# Patient Record
Sex: Female | Born: 1965 | Race: Asian | Hispanic: No | Marital: Single | Smoking: Never smoker
Health system: Southern US, Community
[De-identification: ages and names within clinical notes are randomized; demographics above are authoritative.]

## PROBLEM LIST (undated history)

## (undated) DIAGNOSIS — Z789 Other specified health status: Secondary | ICD-10-CM

---

## 2017-03-14 ENCOUNTER — Other Ambulatory Visit (HOSPITAL_COMMUNITY)
Admission: RE | Admit: 2017-03-14 | Discharge: 2017-03-14 | Disposition: A | Discharge status: Home / Self Care | Payer: BLUE CROSS/BLUE SHIELD | Source: Ambulatory Visit | Attending: Nurse Practitioner | Admitting: Nurse Practitioner

## 2017-03-14 ENCOUNTER — Ambulatory Visit (INDEPENDENT_AMBULATORY_CARE_PROVIDER_SITE_OTHER): Payer: BLUE CROSS/BLUE SHIELD | Admitting: Nurse Practitioner

## 2017-03-14 ENCOUNTER — Encounter: Payer: Self-pay | Admitting: Nurse Practitioner

## 2017-03-14 VITALS — BP 118/76 | HR 67 | Temp 97.7°F | Ht 61.0 in | Wt 125.0 lb

## 2017-03-14 DIAGNOSIS — Z1231 Encounter for screening mammogram for malignant neoplasm of breast: Secondary | ICD-10-CM | POA: Diagnosis not present

## 2017-03-14 DIAGNOSIS — Z124 Encounter for screening for malignant neoplasm of cervix: Secondary | ICD-10-CM | POA: Insufficient documentation

## 2017-03-14 DIAGNOSIS — Z0001 Encounter for general adult medical examination with abnormal findings: Secondary | ICD-10-CM

## 2017-03-14 DIAGNOSIS — Z1211 Encounter for screening for malignant neoplasm of colon: Secondary | ICD-10-CM

## 2017-03-14 DIAGNOSIS — Z1239 Encounter for other screening for malignant neoplasm of breast: Secondary | ICD-10-CM

## 2017-03-14 DIAGNOSIS — J069 Acute upper respiratory infection, unspecified: Secondary | ICD-10-CM

## 2017-03-14 MED ORDER — BENZONATATE 100 MG PO CAPS: 100.0000 mg | ORAL_CAPSULE | Freq: Three times a day (TID) | ORAL | 0 refills | Status: DC | PRN

## 2017-03-14 MED ORDER — GUAIFENESIN-DM 100-10 MG/5ML PO SYRP: 5.0000 mL | ORAL_SOLUTION | ORAL | 0 refills | Status: DC | PRN

## 2017-03-14 MED ORDER — IPRATROPIUM BROMIDE 0.03 % NA SOLN: 2.0000 | Freq: Two times a day (BID) | NASAL | 0 refills | Status: DC

## 2017-03-14 NOTE — Progress Notes (Signed)
Pre visit review using our clinic review tool, if applicable. No additional management support is needed unless otherwise documented below in the visit note. 

## 2017-03-14 NOTE — Progress Notes (Signed)
Subjective:    Patient ID: Elizabeth Ashley, female    DOB: 08/06/1966, 51 y.o.   MRN: [id number]  Patient presents today for complete physical or establish care (new patient) and URI URI   This is a new problem. The current episode started yesterday. The problem has been unchanged. There has been no fever. Associated symptoms include congestion, coughing, rhinorrhea, sinus pain, sneezing and a sore throat. Pertinent negatives include no abdominal pain, chest pain, diarrhea, dysuria, ear pain, headaches, joint pain, joint swelling, nausea, neck pain, plugged ear sensation, rash, swollen glands, vomiting or wheezing. She has tried nothing for the symptoms.   No previous pcp.  Immunizations: (TDAP, Hep C screen, Pneumovax, Influenza, zoster)  Health Maintenance  Topic Date Due  . HIV Screening  04/02/1980  . Tetanus Vaccine  04/02/1984  . Pap Smear  04/02/1986  . Mammogram  04/03/2015  . Colon Cancer Screening  04/03/2015  . Flu Shot  Addressed   Diet:regular Weight:  Wt Readings from Last 3 Encounters:  03/14/17 125 lb (56.7 kg)   Exercise: Fall Risk: Fall Risk  03/14/2017  Falls in the past year? No   Home Safety: single, home with 1child (13yrs), also has 52yrs old girl. Depression/Suicide: Depression screen [facility] 2/9 03/14/2017  Decreased Interest 0  Down, Depressed, Hopeless 0  PHQ - 2 Score 0   No flowsheet data found. Colonoscopy (every 5-82yrs, >50-63yrs):needed Pap Smear (every 9yrs for >21-29 without HPV, every 65yrs for >30-32yrs with HPV):needed Mammogram (yearly, >77yrs):needed  Vision:needed, patient will Dental:up to date Sexual History (birth control, marital status, STD):single  Medications and allergies reviewed with patient and updated if appropriate.  There are no active problems to display for this patient.   No current outpatient prescriptions on file prior to visit.   No current facility-administered medications on file prior to visit.     History  reviewed. No pertinent past medical history.  History reviewed. No pertinent surgical history.  Social History   Social History  . Marital status: Single    Spouse name: N/A  . Number of children: N/A  . Years of education: N/A   Social History Main Topics  . Smoking status: Never Smoker  . Smokeless tobacco: Never Used  . Alcohol use Yes     Comment: everyday  . Drug use: No  . Sexual activity: Not Asked   Other Topics Concern  . None   Social History Narrative  . None    History reviewed. No pertinent family history.      Review of Systems  HENT: Positive for congestion, rhinorrhea, sinus pain, sneezing and sore throat. Negative for ear pain.   Respiratory: Positive for cough. Negative for wheezing.   Cardiovascular: Negative for chest pain.  Gastrointestinal: Negative for abdominal pain, diarrhea, nausea and vomiting.  Genitourinary: Negative for dysuria.  Musculoskeletal: Negative for joint pain and neck pain.  Skin: Negative for rash.  Neurological: Negative for headaches.    Objective:   Vitals:   03/14/17 1512  BP: 118/76  Pulse: 67  Temp: 97.7 F (36.5 C)    Body mass index is 23.62 kg/m.   Physical Examination:  Physical Exam  Constitutional: She is oriented to person, place, and time and well-developed, well-nourished, and in no distress. No distress.  HENT:  Right Ear: Tympanic membrane, external ear and ear canal normal.  Left Ear: Tympanic membrane, external ear and ear canal normal.  Nose: Mucosal edema and rhinorrhea present. Right sinus exhibits no maxillary sinus  tenderness and no frontal sinus tenderness. Left sinus exhibits no maxillary sinus tenderness and no frontal sinus tenderness.  Mouth/Throat: Uvula is midline. Posterior oropharyngeal erythema present. No oropharyngeal exudate.  Eyes: Conjunctivae and EOM are normal. Pupils are equal, round, and reactive to light. No scleral icterus.  Neck: Normal range of motion. Neck  supple. No thyromegaly present.  Cardiovascular: Normal rate, regular rhythm, normal heart sounds and intact distal pulses.   Pulmonary/Chest: Effort normal and breath sounds normal. She exhibits no tenderness. Right breast exhibits no inverted nipple, no mass, no nipple discharge, no skin change and no tenderness. Left breast exhibits no inverted nipple, no mass, no nipple discharge, no skin change and no tenderness. Breasts are symmetrical.  Abdominal: Soft. Bowel sounds are normal. She exhibits no distension. There is no tenderness.  Genitourinary: Rectum normal, vagina normal, right adnexa normal, left adnexa normal and vulva normal. Rectal exam shows no external hemorrhoid. Uterus is not tender. Cervix exhibits no motion tenderness and no tenderness. No vaginal discharge found.  Musculoskeletal: Normal range of motion. She exhibits no edema or tenderness.  Lymphadenopathy:    She has no cervical adenopathy.  Neurological: She is alert and oriented to person, place, and time. Gait normal.  Skin: Skin is warm and dry.  Psychiatric: Affect and judgment normal.  Vitals reviewed.   ASSESSMENT and PLAN:  Wrenn was seen today for establish care.  Diagnoses and all orders for this visit:  Encounter for preventative adult health care exam with abnormal findings -     HIV antibody; Future -     CBC w/Diff; Future -     Comprehensive metabolic panel; Future -     TSH; Future -     Lipid panel; Future -     Cytology - PAP  Acute URI -     ipratropium (ATROVENT) 0.03 % nasal spray; Place 2 sprays into both nostrils 2 (two) times daily. Do not use for more than 5days. -     guaiFENesin-dextromethorphan (ROBITUSSIN DM) 100-10 MG/5ML syrup; Take 5 mLs by mouth every 4 (four) hours as needed for cough. -     benzonatate (TESSALON) 100 MG capsule; Take 1 capsule (100 mg total) by mouth 3 (three) times daily as needed for cough.  Colon cancer screening -     Ambulatory referral to  Gastroenterology  Encounter for Papanicolaou smear for cervical cancer screening -     Cytology - PAP  Breast cancer screening -     MM Digital Screening; Future   No problem-specific Assessment & Plan notes found for this encounter.     Follow up: Return in about 2 weeks (around 03/28/2017) for shoulder mass.  Wilfred Lacy, NP

## 2017-03-14 NOTE — Patient Instructions (Signed)
URI Instructions: Encourage adequate oral hydration.  Use over-the-counter  "cold" medicines  such as "Tylenol cold" , "Advil cold",  "Mucinex" or" Mucinex D"  for cough and congestion.  Avoid decongestants if you have high blood pressure. Use" Delsym" or" Robitussin" cough syrup varietis for cough.  You can use plain "Tylenol" or "Advi"l for fever, chills and achyness.   "Common cold" symptoms are usually triggered by a virus.  The antibiotics are usually not necessary. On average, a" viral cold" illness would take 4-7 days to resolve. Please, make an appointment if you are not better or if you're worse.  Health Maintenance, Female Adopting a healthy lifestyle and getting preventive care can go a long way to promote health and wellness. Talk with your health care provider about what schedule of regular examinations is right for you. This is a good chance for you to check in with your provider about disease prevention and staying healthy. In between checkups, there are plenty of things you can do on your own. Experts have done a lot of research about which lifestyle changes and preventive measures are most likely to keep you healthy. Ask your health care provider for more information. Weight and diet Eat a healthy diet  Be sure to include plenty of vegetables, fruits, low-fat dairy products, and lean protein.  Do not eat a lot of foods high in solid fats, added sugars, or salt.  Get regular exercise. This is one of the most important things you can do for your health.  Most adults should exercise for at least 150 minutes each week. The exercise should increase your heart rate and make you sweat (moderate-intensity exercise).  Most adults should also do strengthening exercises at least twice a week. This is in addition to the moderate-intensity exercise. Maintain a healthy weight  Body mass index (BMI) is a measurement that can be used to identify possible weight problems. It estimates body  fat based on height and weight. Your health care provider can help determine your BMI and help you achieve or maintain a healthy weight.  For females 48 years of age and older:  A BMI below 18.5 is considered underweight.  A BMI of 18.5 to 24.9 is normal.  A BMI of 25 to 29.9 is considered overweight.  A BMI of 30 and above is considered obese. Watch levels of cholesterol and blood lipids  You should start having your blood tested for lipids and cholesterol at 52 years of age, then have this test every 5 years.  You may need to have your cholesterol levels checked more often if:  Your lipid or cholesterol levels are high.  You are older than 52 years of age.  You are at high risk for heart disease. Cancer screening Lung Cancer  Lung cancer screening is recommended for adults 61-49 years old who are at high risk for lung cancer because of a history of smoking.  A yearly low-dose CT scan of the lungs is recommended for people who:  Currently smoke.  Have quit within the past 15 years.  Have at least a 30-pack-year history of smoking. A pack year is smoking an average of one pack of cigarettes a day for 1 year.  Yearly screening should continue until it has been 15 years since you quit.  Yearly screening should stop if you develop a health problem that would prevent you from having lung cancer treatment. Breast Cancer  Practice breast self-awareness. This means understanding how your breasts normally appear and  feel.  It also means doing regular breast self-exams. Let your health care provider know about any changes, no matter how small.  If you are in your 20s or 30s, you should have a clinical breast exam (CBE) by a health care provider every 1-3 years as part of a regular health exam.  If you are 68 or older, have a CBE every year. Also consider having a breast X-ray (mammogram) every year.  If you have a family history of breast cancer, talk to your health care  provider about genetic screening.  If you are at high risk for breast cancer, talk to your health care provider about having an MRI and a mammogram every year.  Breast cancer gene (BRCA) assessment is recommended for women who have family members with BRCA-related cancers. BRCA-related cancers include:  Breast.  Ovarian.  Tubal.  Peritoneal cancers.  Results of the assessment will determine the need for genetic counseling and BRCA1 and BRCA2 testing. Cervical Cancer  Your health care provider may recommend that you be screened regularly for cancer of the pelvic organs (ovaries, uterus, and vagina). This screening involves a pelvic examination, including checking for microscopic changes to the surface of your cervix (Pap test). You may be encouraged to have this screening done every 3 years, beginning at age 63.  For women ages 14-65, health care providers may recommend pelvic exams and Pap testing every 3 years, or they may recommend the Pap and pelvic exam, combined with testing for human papilloma virus (HPV), every 5 years. Some types of HPV increase your risk of cervical cancer. Testing for HPV may also be done on women of any age with unclear Pap test results.  Other health care providers may not recommend any screening for nonpregnant women who are considered low risk for pelvic cancer and who do not have symptoms. Ask your health care provider if a screening pelvic exam is right for you.  If you have had past treatment for cervical cancer or a condition that could lead to cancer, you need Pap tests and screening for cancer for at least 20 years after your treatment. If Pap tests have been discontinued, your risk factors (such as having a new sexual partner) need to be reassessed to determine if screening should resume. Some women have medical problems that increase the chance of getting cervical cancer. In these cases, your health care provider may recommend more frequent screening and  Pap tests. Colorectal Cancer  This type of cancer can be detected and often prevented.  Routine colorectal cancer screening usually begins at 52 years of age and continues through 52 years of age.  Your health care provider may recommend screening at an earlier age if you have risk factors for colon cancer.  Your health care provider may also recommend using home test kits to check for hidden blood in the stool.  A small camera at the end of a tube can be used to examine your colon directly (sigmoidoscopy or colonoscopy). This is done to check for the earliest forms of colorectal cancer.  Routine screening usually begins at age 45.  Direct examination of the colon should be repeated every 5-10 years through 52 years of age. However, you may need to be screened more often if early forms of precancerous polyps or small growths are found. Skin Cancer  Check your skin from head to toe regularly.  Tell your health care provider about any new moles or changes in moles, especially if there is a  change in a mole's shape or color.  Also tell your health care provider if you have a mole that is larger than the size of a pencil eraser.  Always use sunscreen. Apply sunscreen liberally and repeatedly throughout the day.  Protect yourself by wearing long sleeves, pants, a wide-brimmed hat, and sunglasses whenever you are outside. Heart disease, diabetes, and high blood pressure  High blood pressure causes heart disease and increases the risk of stroke. High blood pressure is more likely to develop in:  People who have blood pressure in the high end of the normal range (130-139/85-89 mm Hg).  People who are overweight or obese.  People who are African American.  If you are 78-56 years of age, have your blood pressure checked every 3-5 years. If you are 28 years of age or older, have your blood pressure checked every year. You should have your blood pressure measured twice-once when you are at a  hospital or clinic, and once when you are not at a hospital or clinic. Record the average of the two measurements. To check your blood pressure when you are not at a hospital or clinic, you can use:  An automated blood pressure machine at a pharmacy.  A home blood pressure monitor.  If you are between 44 years and 43 years old, ask your health care provider if you should take aspirin to prevent strokes.  Have regular diabetes screenings. This involves taking a blood sample to check your fasting blood sugar level.  If you are at a normal weight and have a low risk for diabetes, have this test once every three years after 52 years of age.  If you are overweight and have a high risk for diabetes, consider being tested at a younger age or more often. Preventing infection Hepatitis B  If you have a higher risk for hepatitis B, you should be screened for this virus. You are considered at high risk for hepatitis B if:  You were born in a country where hepatitis B is common. Ask your health care provider which countries are considered high risk.  Your parents were born in a high-risk country, and you have not been immunized against hepatitis B (hepatitis B vaccine).  You have HIV or AIDS.  You use needles to inject street drugs.  You live with someone who has hepatitis B.  You have had sex with someone who has hepatitis B.  You get hemodialysis treatment.  You take certain medicines for conditions, including cancer, organ transplantation, and autoimmune conditions. Hepatitis C  Blood testing is recommended for:  Everyone born from [birth year] through 1966.  Anyone with known risk factors for hepatitis C. Sexually transmitted infections (STIs)  You should be screened for sexually transmitted infections (STIs) including gonorrhea and chlamydia if:  You are sexually active and are younger than 52 years of age.  You are older than 52 years of age and your health care provider tells you  that you are at risk for this type of infection.  Your sexual activity has changed since you were last screened and you are at an increased risk for chlamydia or gonorrhea. Ask your health care provider if you are at risk.  If you do not have HIV, but are at risk, it may be recommended that you take a prescription medicine daily to prevent HIV infection. This is called pre-exposure prophylaxis (PrEP). You are considered at risk if:  You are sexually active and do not regularly use condoms or  know the HIV status of your partner(s).  You take drugs by injection.  You are sexually active with a partner who has HIV. Talk with your health care provider about whether you are at high risk of being infected with HIV. If you choose to begin PrEP, you should first be tested for HIV. You should then be tested every 3 months for as long as you are taking PrEP. Pregnancy  If you are premenopausal and you may become pregnant, ask your health care provider about preconception counseling.  If you may become pregnant, take 400 to 800 micrograms (mcg) of folic acid every day.  If you want to prevent pregnancy, talk to your health care provider about birth control (contraception). Osteoporosis and menopause  Osteoporosis is a disease in which the bones lose minerals and strength with aging. This can result in serious bone fractures. Your risk for osteoporosis can be identified using a bone density scan.  If you are 46 years of age or older, or if you are at risk for osteoporosis and fractures, ask your health care provider if you should be screened.  Ask your health care provider whether you should take a calcium or vitamin D supplement to lower your risk for osteoporosis.  Menopause may have certain physical symptoms and risks.  Hormone replacement therapy may reduce some of these symptoms and risks. Talk to your health care provider about whether hormone replacement therapy is right for you. Follow  these instructions at home:  Schedule regular health, dental, and eye exams.  Stay current with your immunizations.  Do not use any tobacco products including cigarettes, chewing tobacco, or electronic cigarettes.  If you are pregnant, do not drink alcohol.  If you are breastfeeding, limit how much and how often you drink alcohol.  Limit alcohol intake to no more than 1 drink per day for nonpregnant women. One drink equals 12 ounces of beer, 5 ounces of wine, or 1 ounces of hard liquor.  Do not use street drugs.  Do not share needles.  Ask your health care provider for help if you need support or information about quitting drugs.  Tell your health care provider if you often feel depressed.  Tell your health care provider if you have ever been abused or do not feel safe at home. This information is not intended to replace advice given to you by your health care provider. Make sure you discuss any questions you have with your health care provider. Document Released: 07/02/2011 Document Revised: 05/24/2016 Document Reviewed: 09/20/2015 Elsevier Interactive Patient Education  2017 [employer].

## 2017-03-19 LAB — CYTOLOGY - PAP
ADEQUACY: ABSENT
DIAGNOSIS: NEGATIVE
HPV (WINDOPATH): NOT DETECTED

## 2017-03-28 ENCOUNTER — Other Ambulatory Visit (INDEPENDENT_AMBULATORY_CARE_PROVIDER_SITE_OTHER): Payer: BLUE CROSS/BLUE SHIELD

## 2017-03-28 ENCOUNTER — Encounter: Payer: Self-pay | Admitting: Nurse Practitioner

## 2017-03-28 ENCOUNTER — Ambulatory Visit (INDEPENDENT_AMBULATORY_CARE_PROVIDER_SITE_OTHER): Payer: BLUE CROSS/BLUE SHIELD | Admitting: Nurse Practitioner

## 2017-03-28 VITALS — BP 110/70 | HR 64 | Temp 97.9°F | Ht 61.0 in | Wt 123.0 lb

## 2017-03-28 DIAGNOSIS — Z0001 Encounter for general adult medical examination with abnormal findings: Secondary | ICD-10-CM | POA: Diagnosis not present

## 2017-03-28 DIAGNOSIS — R2231 Localized swelling, mass and lump, right upper limb: Secondary | ICD-10-CM | POA: Diagnosis not present

## 2017-03-28 DIAGNOSIS — Z23 Encounter for immunization: Secondary | ICD-10-CM

## 2017-03-28 DIAGNOSIS — M25511 Pain in right shoulder: Secondary | ICD-10-CM | POA: Diagnosis not present

## 2017-03-28 LAB — COMPREHENSIVE METABOLIC PANEL
ALBUMIN: 4.5 g/dL (ref 3.5–5.2)
ALT: 14 U/L (ref 0–35)
AST: 18 U/L (ref 0–37)
Alkaline Phosphatase: 64 U/L (ref 39–117)
BUN: 19 mg/dL (ref 6–23)
CHLORIDE: 104 meq/L (ref 96–112)
CO2: 32 meq/L (ref 19–32)
CREATININE: 0.7 mg/dL (ref 0.40–1.20)
Calcium: 9.5 mg/dL (ref 8.4–10.5)
GFR: 93.4 mL/min (ref >60.00)
Glucose, Bld: 82 mg/dL (ref 70–99)
Potassium: 3.8 mEq/L (ref 3.5–5.1)
SODIUM: 141 meq/L (ref 135–145)
Total Bilirubin: 0.5 mg/dL (ref 0.2–1.2)
Total Protein: 7.6 g/dL (ref 6.0–8.3)

## 2017-03-28 LAB — CBC WITH DIFFERENTIAL/PLATELET
BASOS PCT: 0.9 % (ref 0.0–3.0)
Basophils Absolute: 0 10*3/uL (ref 0.0–0.1)
Eosinophils Absolute: 0.2 10*3/uL (ref 0.0–0.7)
Eosinophils Relative: 4.7 % (ref 0.0–5.0)
HCT: 40.7 % (ref 36.0–46.0)
HEMOGLOBIN: 13.2 g/dL (ref 12.0–15.0)
Lymphocytes Relative: 36.4 % (ref 12.0–46.0)
Lymphs Abs: 1.7 10*3/uL (ref 0.7–4.0)
MCHC: 32.5 g/dL (ref 30.0–36.0)
MCV: 93 fl (ref 78.0–100.0)
MONO ABS: 0.4 10*3/uL (ref 0.1–1.0)
Monocytes Relative: 8.5 % (ref 3.0–12.0)
Neutro Abs: 2.3 10*3/uL (ref 1.4–7.7)
Neutrophils Relative %: 49.5 % (ref 43.0–77.0)
PLATELETS: 323 10*3/uL (ref 150.0–400.0)
RBC: 4.38 Mil/uL (ref 3.87–5.11)
RDW: 13.2 % (ref 11.5–15.5)
WBC: 4.6 10*3/uL (ref 4.0–10.5)

## 2017-03-28 LAB — LIPID PANEL
CHOL/HDL RATIO: 3
Cholesterol: 195 mg/dL (ref 0–200)
HDL: 73.2 mg/dL (ref >39.00)
LDL CALC: 107 mg/dL — AB (ref 0–99)
NONHDL: 121.75
Triglycerides: 75 mg/dL (ref 0.0–149.0)
VLDL: 15 mg/dL (ref 0.0–40.0)

## 2017-03-28 LAB — TSH: TSH: 0.74 u[IU]/mL (ref 0.35–4.50)

## 2017-03-28 NOTE — Progress Notes (Signed)
Normal results

## 2017-03-28 NOTE — Progress Notes (Signed)
Pre visit review using our clinic review tool, if applicable. No additional management support is needed unless otherwise documented below in the visit note. 

## 2017-03-28 NOTE — Patient Instructions (Signed)
You will be called with appt for ultrasound.

## 2017-03-28 NOTE — Progress Notes (Addendum)
   Subjective:  Patient ID: Elizabeth Ashley, female    DOB: 1966/04/19  Age: 51 y.o. MRN: [id number]  CC: Follow-up (follow up mass on right shoulder. tdap?)  HPI  Elizabeth Ashley presents with right posterior shoulder mass. Onset several years ago, bt has noticed gradual increase in size and intermittent tenderness around shoulder joint. Denies any paresthesia or arm swelling.  Outpatient Medications Prior to Visit  Medication Sig Dispense Refill  . multivitamin-iron-minerals-folic acid (CENTRUM) chewable tablet Chew 1 tablet by mouth daily.    . benzonatate (TESSALON) 100 MG capsule Take 1 capsule (100 mg total) by mouth 3 (three) times daily as needed for cough. (Patient not taking: Reported on 03/28/2017) 20 capsule 0  . guaiFENesin-dextromethorphan (ROBITUSSIN DM) 100-10 MG/5ML syrup Take 5 mLs by mouth every 4 (four) hours as needed for cough. (Patient not taking: Reported on 03/28/2017) 118 mL 0  . ipratropium (ATROVENT) 0.03 % nasal spray Place 2 sprays into both nostrils 2 (two) times daily. Do not use for more than 5days. (Patient not taking: Reported on 03/28/2017) 30 mL 0   No facility-administered medications prior to visit.     ROS See HPI  Objective:  BP 110/70   Pulse 64   Temp 97.9 F (36.6 C)   Ht 5\' 1"  (1.549 m)   Wt 123 lb (55.8 kg)   SpO2 97%   BMI 23.24 kg/m   BP Readings from Last 3 Encounters:  03/28/17 110/70  03/14/17 118/76    Wt Readings from Last 3 Encounters:  03/28/17 123 lb (55.8 kg)  03/14/17 125 lb (56.7 kg)    Physical Exam  Cardiovascular: Normal rate.   Pulmonary/Chest: Effort normal.  Musculoskeletal: Normal range of motion.       Right shoulder: She exhibits swelling. She exhibits normal range of motion, no tenderness, no bony tenderness, no effusion, no crepitus, no deformity, no laceration, no pain, no spasm, normal pulse and normal strength.       Arms: Skin: Lesion noted.  Vitals reviewed.   Lab Results  Component Value Date   WBC 4.6  03/28/2017   HGB 13.2 03/28/2017   HCT 40.7 03/28/2017   PLT 323.0 03/28/2017   GLUCOSE 82 03/28/2017   CHOL 195 03/28/2017   TRIG 75.0 03/28/2017   HDL 73.20 03/28/2017   LDLCALC 107 (H) 03/28/2017   ALT 14 03/28/2017   AST 18 03/28/2017   NA 141 03/28/2017   K 3.8 03/28/2017   CL 104 03/28/2017   CREATININE 0.70 03/28/2017   BUN 19 03/28/2017   CO2 32 03/28/2017   TSH 0.74 03/28/2017    No results found.  Assessment & Plan:   Elizabeth Ashley was seen today for follow-up.  Diagnoses and all orders for this visit:  Mass of skin of shoulder, right -     [place] Chest; Future  Acute pain of right shoulder -     [place] Chest; Future  Need for diphtheria-tetanus-pertussis (Tdap) vaccine, adult/adolescent -     Tdap vaccine greater than or equal to 7yo IM   I have discontinued Elizabeth Ashley's ipratropium, guaiFENesin-dextromethorphan, and benzonatate. I am also having her maintain her multivitamin-iron-minerals-folic acid.  No orders of the defined types were placed in this encounter.   Follow-up: Return if symptoms worsen or fail to improve.  Wilfred Lacy, NP

## 2017-03-29 LAB — HIV ANTIBODY (ROUTINE TESTING W REFLEX): HIV: NONREACTIVE

## 2017-03-29 NOTE — Progress Notes (Signed)
Normal results

## 2017-04-04 ENCOUNTER — Ambulatory Visit
Admission: RE | Admit: 2017-04-04 | Discharge: 2017-04-04 | Disposition: A | Discharge status: Home / Self Care | Payer: BLUE CROSS/BLUE SHIELD | Source: Ambulatory Visit | Attending: Nurse Practitioner | Admitting: Nurse Practitioner

## 2017-04-04 DIAGNOSIS — M25511 Pain in right shoulder: Secondary | ICD-10-CM

## 2017-04-04 DIAGNOSIS — R2231 Localized swelling, mass and lump, right upper limb: Secondary | ICD-10-CM

## 2017-04-05 ENCOUNTER — Other Ambulatory Visit: Payer: Self-pay | Admitting: Nurse Practitioner

## 2017-04-05 DIAGNOSIS — R2231 Localized swelling, mass and lump, right upper limb: Secondary | ICD-10-CM

## 2017-04-05 DIAGNOSIS — R229 Localized swelling, mass and lump, unspecified: Secondary | ICD-10-CM

## 2017-04-05 DIAGNOSIS — M25511 Pain in right shoulder: Secondary | ICD-10-CM

## 2017-04-10 ENCOUNTER — Other Ambulatory Visit: Payer: Self-pay | Admitting: Nurse Practitioner

## 2017-04-10 DIAGNOSIS — J069 Acute upper respiratory infection, unspecified: Secondary | ICD-10-CM

## 2017-04-23 ENCOUNTER — Encounter: Payer: Self-pay | Admitting: Nurse Practitioner

## 2017-04-23 ENCOUNTER — Ambulatory Visit
Admission: RE | Admit: 2017-04-23 | Discharge: 2017-04-23 | Disposition: A | Discharge status: Home / Self Care | Payer: BLUE CROSS/BLUE SHIELD | Source: Ambulatory Visit | Attending: Nurse Practitioner | Admitting: Nurse Practitioner

## 2017-04-23 DIAGNOSIS — Z1239 Encounter for other screening for malignant neoplasm of breast: Secondary | ICD-10-CM

## 2017-04-23 IMAGING — US US CHEST/MEDIASTINUM
1 series · 14 of 14 positions shown · non-contrast
Comparison: None.

CLINICAL DATA: Mass right shoulder with pain

EXAM:
CHEST ULTRASOUND

[Series 1: us chest/mediastinum · 0.06mm/px · 14 of 14 slices shown]
[im 1/14]
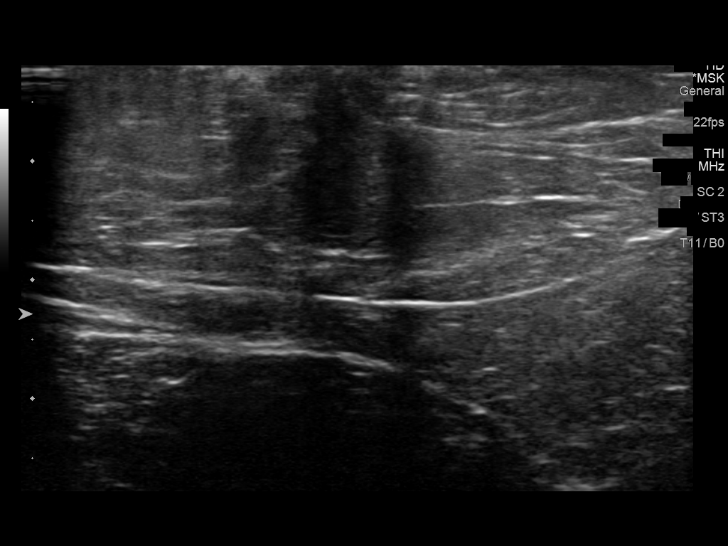
[im 2/14]
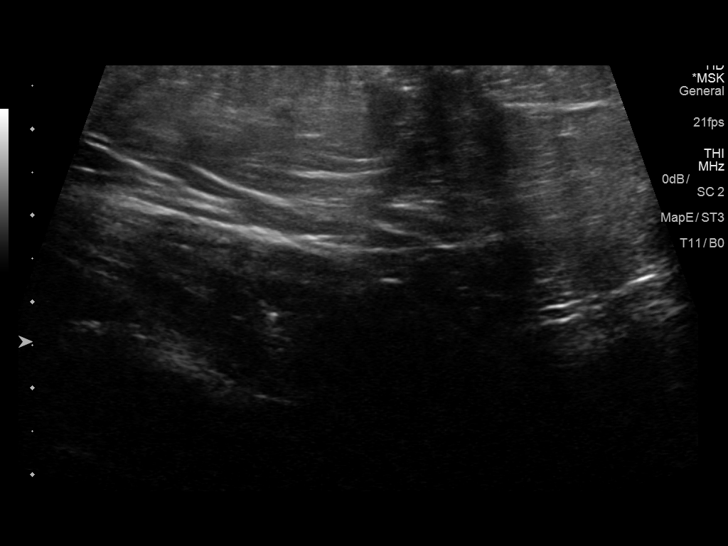
[im 3/14]
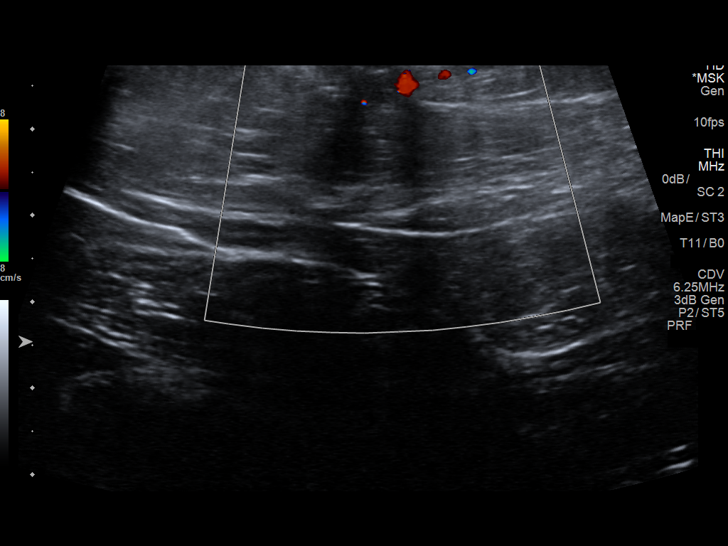
[im 4/14]
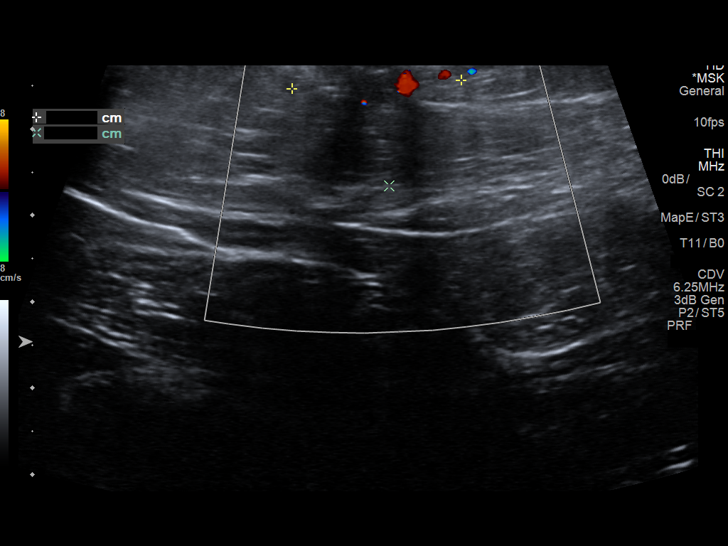
[im 5/14]
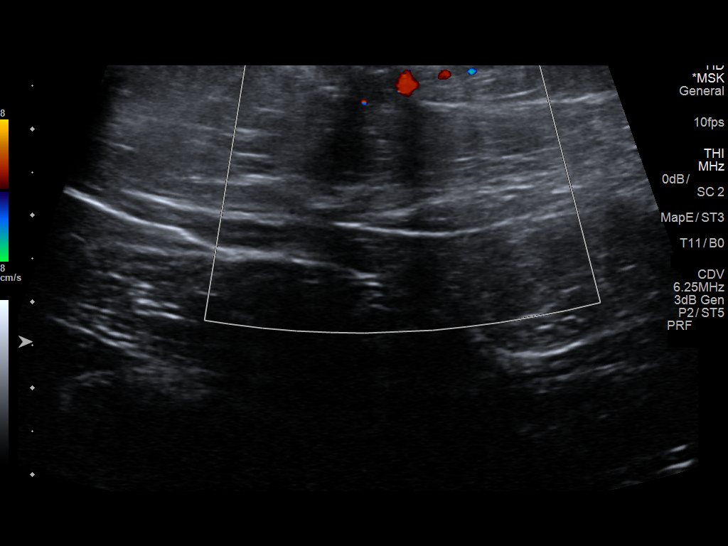
[im 6/14]
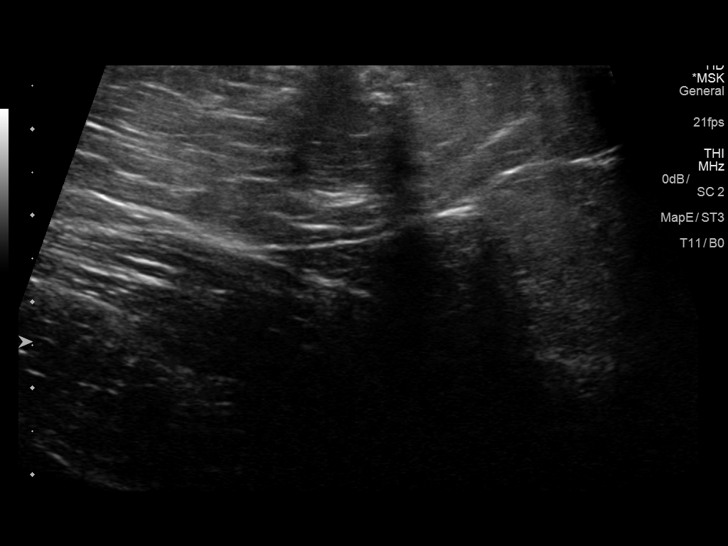
[im 7/14]
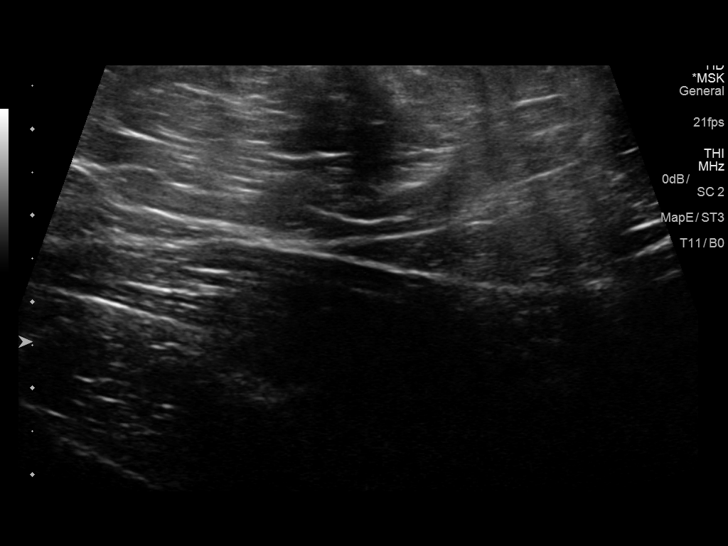
[im 8/14]
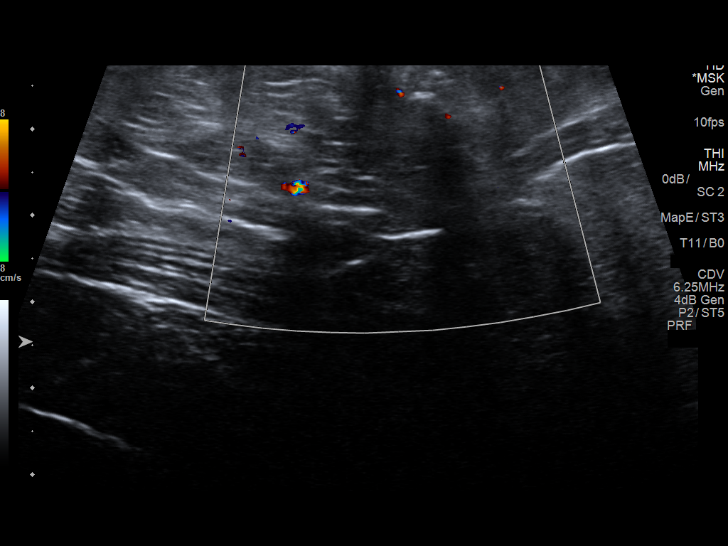
[im 9/14]
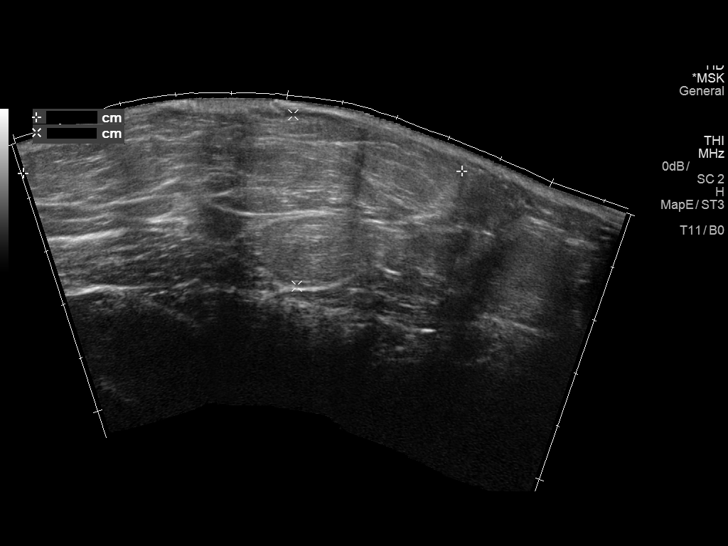
[im 10/14]
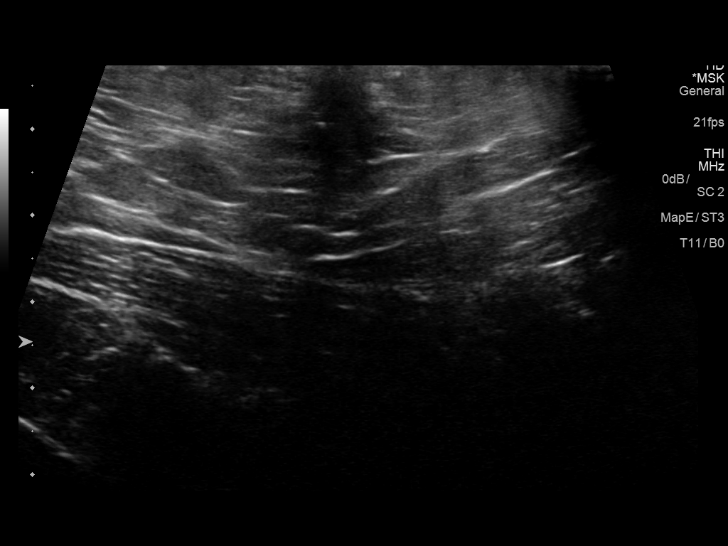
[im 11/14]
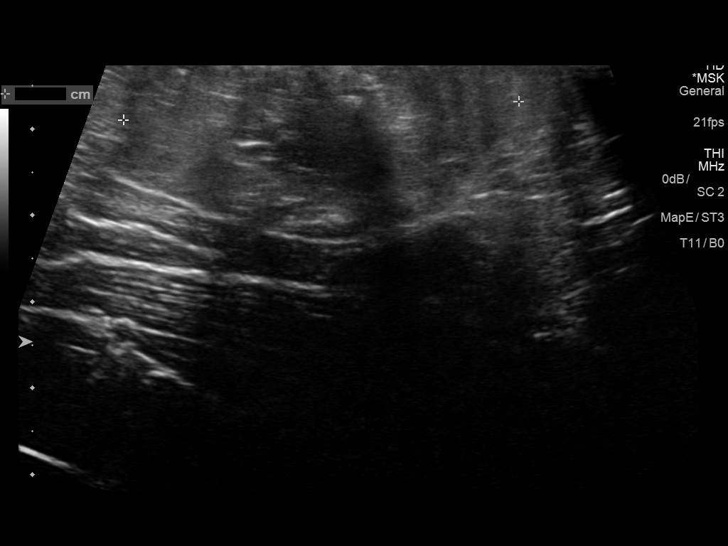
[im 12/14]
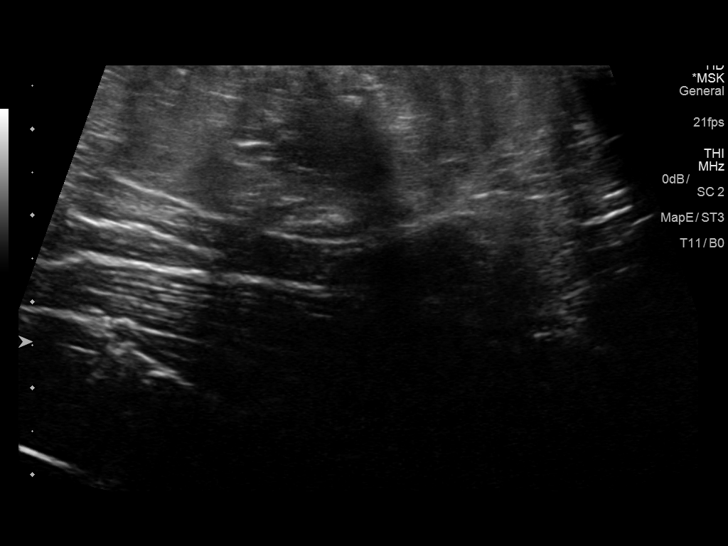
[im 13/14]
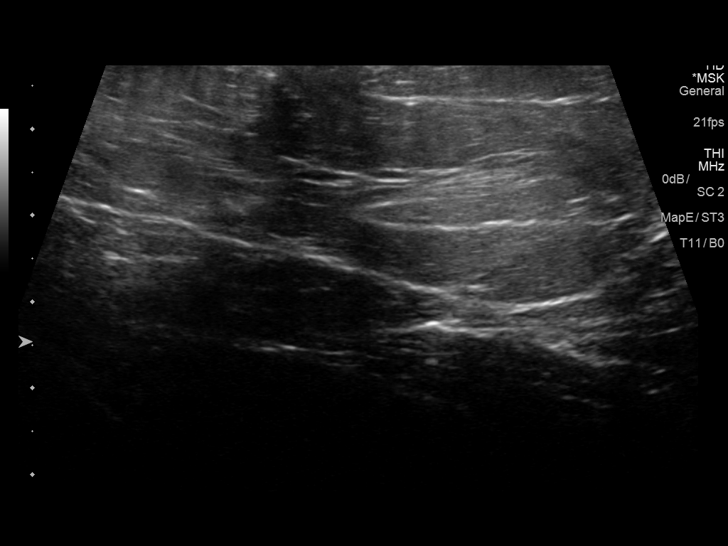
[im 14/14]
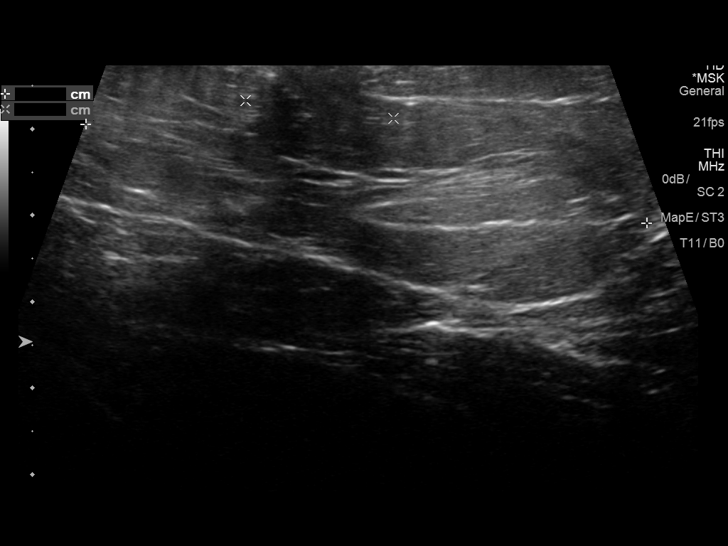

[14 of 14 positions shown; findings below may reference images not displayed]

FINDINGS: Scanning limited to the area of concern posterior to the shoulder
joint overlying the scapula

Solid superficial mass is seen in the subcutaneous tissues. The mass
measures 2 x 1.6 cm and is predominately hypoechoic without
shadowing. This is surrounded by fatty tissue. Margins are
ill-defined.
IMPRESSION: 2 x 1.6 cm solid mass in the subcutaneous tissues overlying the
scapula. This appearance is nonspecific but this is not a cyst.
Biopsy if clinically appropriate.

## 2017-05-09 ENCOUNTER — Encounter: Payer: Self-pay | Admitting: Nurse Practitioner

## 2017-06-07 ENCOUNTER — Encounter (HOSPITAL_COMMUNITY)
Admission: RE | Admit: 2017-06-07 | Discharge: 2017-06-07 | Disposition: A | Discharge status: Home / Self Care | Payer: BLUE CROSS/BLUE SHIELD | Source: Ambulatory Visit | Attending: General Surgery | Admitting: General Surgery

## 2017-06-07 ENCOUNTER — Encounter (HOSPITAL_COMMUNITY): Payer: Self-pay

## 2017-06-07 DIAGNOSIS — Z01812 Encounter for preprocedural laboratory examination: Secondary | ICD-10-CM | POA: Insufficient documentation

## 2017-06-07 HISTORY — DX: Other specified health status: Z78.9

## 2017-06-07 LAB — CBC
HEMATOCRIT: 39.5 % (ref 36.0–46.0)
Hemoglobin: 12.4 g/dL (ref 12.0–15.0)
MCH: 29.5 pg (ref 26.0–34.0)
MCHC: 31.4 g/dL (ref 30.0–36.0)
MCV: 94 fL (ref 78.0–100.0)
Platelets: 288 10*3/uL (ref 150–400)
RBC: 4.2 MIL/uL (ref 3.87–5.11)
RDW: 13.2 % (ref 11.5–15.5)
WBC: 4.7 10*3/uL (ref 4.0–10.5)

## 2017-06-07 NOTE — Pre-Procedure Instructions (Signed)
    Elizabeth Ashley  06/07/2017      [facility] [address], [place] [address] Phone: [phone] Fax: [phone]    Your procedure is scheduled on 06/13/17.  Report to [facility] Admitting at 11 A.M.  Call this number if you have problems the morning of surgery:  [phone]   Remember:  Do not eat food or drink liquids after midnight.  Take these medicines the morning of surgery with A SIP OF WATER --   Do not wear jewelry, make-up or nail polish.  Do not wear lotions, powders, or perfumes, or deoderant.  Do not shave 48 hours prior to surgery.  Men may shave face and neck.  Do not bring valuables to the hospital.  [facility] is not responsible for any belongings or valuables.  Contacts, dentures or bridgework may not be worn into surgery.  Leave your suitcase in the car.  After surgery it may be brought to your room.  For patients admitted to the hospital, discharge time will be determined by your treatment team.  Patients discharged the day of surgery will not be allowed to drive home.   Name and phone number of your driver:    Special instructions:    Please read over the following fact sheets that you were given.

## 2017-06-10 ENCOUNTER — Ambulatory Visit: Payer: Self-pay | Admitting: General Surgery

## 2017-06-13 ENCOUNTER — Encounter (HOSPITAL_COMMUNITY): Payer: Self-pay | Admitting: *Deleted

## 2017-06-13 ENCOUNTER — Encounter (HOSPITAL_COMMUNITY)
Admission: RE | Disposition: A | Discharge status: Home / Self Care | Payer: Self-pay | Source: Ambulatory Visit | Attending: General Surgery

## 2017-06-13 ENCOUNTER — Ambulatory Visit (HOSPITAL_COMMUNITY): Payer: BLUE CROSS/BLUE SHIELD | Admitting: Certified Registered Nurse Anesthetist

## 2017-06-13 ENCOUNTER — Ambulatory Visit (HOSPITAL_COMMUNITY)
Admission: RE | Admit: 2017-06-13 | Discharge: 2017-06-13 | Disposition: A | Discharge status: Home / Self Care | Payer: BLUE CROSS/BLUE SHIELD | Source: Ambulatory Visit | Attending: General Surgery | Admitting: General Surgery

## 2017-06-13 DIAGNOSIS — D171 Benign lipomatous neoplasm of skin and subcutaneous tissue of trunk: Secondary | ICD-10-CM | POA: Insufficient documentation

## 2017-06-13 HISTORY — PX: LIPOMA EXCISION: SHX5283

## 2017-06-13 LAB — PREGNANCY, URINE: PREG TEST UR: NEGATIVE

## 2017-06-13 SURGERY — EXCISION LIPOMA
Anesthesia: General | Site: Shoulder | Laterality: Right

## 2017-06-13 MED ORDER — DEXAMETHASONE SODIUM PHOSPHATE 10 MG/ML IJ SOLN
INTRAMUSCULAR | Status: AC
  Filled 2017-06-13: qty 1

## 2017-06-13 MED ORDER — LIDOCAINE HCL (CARDIAC) 20 MG/ML IV SOLN
INTRAVENOUS | Status: DC | PRN
  Administered 2017-06-13: 100 mg via INTRAVENOUS

## 2017-06-13 MED ORDER — ROCURONIUM BROMIDE 100 MG/10ML IV SOLN
INTRAVENOUS | Status: DC | PRN
  Administered 2017-06-13: 50 mg via INTRAVENOUS

## 2017-06-13 MED ORDER — ROCURONIUM BROMIDE 10 MG/ML (PF) SYRINGE
PREFILLED_SYRINGE | INTRAVENOUS | Status: AC
  Filled 2017-06-13: qty 5

## 2017-06-13 MED ORDER — FENTANYL CITRATE (PF) 100 MCG/2ML IJ SOLN
INTRAMUSCULAR | Status: DC | PRN
  Administered 2017-06-13: 100 ug via INTRAVENOUS
  Administered 2017-06-13: 50 ug via INTRAVENOUS

## 2017-06-13 MED ORDER — DEXAMETHASONE SODIUM PHOSPHATE 10 MG/ML IJ SOLN
INTRAMUSCULAR | Status: DC | PRN
  Administered 2017-06-13: 10 mg via INTRAVENOUS

## 2017-06-13 MED ORDER — CHLORHEXIDINE GLUCONATE CLOTH 2 % EX PADS: 6.0000 | MEDICATED_PAD | Freq: Once | CUTANEOUS | Status: DC

## 2017-06-13 MED ORDER — SUCCINYLCHOLINE CHLORIDE 200 MG/10ML IV SOSY
PREFILLED_SYRINGE | INTRAVENOUS | Status: AC
  Filled 2017-06-13: qty 10

## 2017-06-13 MED ORDER — HYDROCODONE-ACETAMINOPHEN 5-325 MG PO TABS: 1.0000 | ORAL_TABLET | ORAL | 0 refills | Status: DC | PRN

## 2017-06-13 MED ORDER — ONDANSETRON HCL 4 MG/2ML IJ SOLN
INTRAMUSCULAR | Status: DC | PRN
  Administered 2017-06-13: 4 mg via INTRAVENOUS

## 2017-06-13 MED ORDER — CELECOXIB 200 MG PO CAPS
400.0000 mg | ORAL_CAPSULE | ORAL | Status: AC
  Administered 2017-06-13: 400 mg via ORAL

## 2017-06-13 MED ORDER — CEFAZOLIN SODIUM-DEXTROSE 2-4 GM/100ML-% IV SOLN
INTRAVENOUS | Status: AC
  Filled 2017-06-13: qty 100

## 2017-06-13 MED ORDER — LIDOCAINE 2% (20 MG/ML) 5 ML SYRINGE
INTRAMUSCULAR | Status: AC
  Filled 2017-06-13: qty 5

## 2017-06-13 MED ORDER — CELECOXIB 200 MG PO CAPS
ORAL_CAPSULE | ORAL | Status: AC
  Filled 2017-06-13: qty 2

## 2017-06-13 MED ORDER — 0.9 % SODIUM CHLORIDE (POUR BTL) OPTIME
TOPICAL | Status: DC | PRN
  Administered 2017-06-13: 1000 mL

## 2017-06-13 MED ORDER — ONDANSETRON HCL 4 MG/2ML IJ SOLN
INTRAMUSCULAR | Status: AC
  Filled 2017-06-13: qty 2

## 2017-06-13 MED ORDER — GABAPENTIN 300 MG PO CAPS
ORAL_CAPSULE | ORAL | Status: AC
  Filled 2017-06-13: qty 1

## 2017-06-13 MED ORDER — PROPOFOL 10 MG/ML IV BOLUS
INTRAVENOUS | Status: DC | PRN
  Administered 2017-06-13: 150 mg via INTRAVENOUS

## 2017-06-13 MED ORDER — FENTANYL CITRATE (PF) 250 MCG/5ML IJ SOLN
INTRAMUSCULAR | Status: AC
  Filled 2017-06-13: qty 5

## 2017-06-13 MED ORDER — MIDAZOLAM HCL 5 MG/5ML IJ SOLN
INTRAMUSCULAR | Status: DC | PRN
  Administered 2017-06-13: 2 mg via INTRAVENOUS

## 2017-06-13 MED ORDER — BUPIVACAINE-EPINEPHRINE 0.25% -1:200000 IJ SOLN
INTRAMUSCULAR | Status: DC | PRN
  Administered 2017-06-13: 28 mL

## 2017-06-13 MED ORDER — EPHEDRINE SULFATE 50 MG/ML IJ SOLN
INTRAMUSCULAR | Status: DC | PRN
  Administered 2017-06-13: 10 mg via INTRAVENOUS
  Administered 2017-06-13: 15 mg via INTRAVENOUS

## 2017-06-13 MED ORDER — MIDAZOLAM HCL 2 MG/2ML IJ SOLN
INTRAMUSCULAR | Status: AC
  Filled 2017-06-13: qty 2

## 2017-06-13 MED ORDER — SUGAMMADEX SODIUM 200 MG/2ML IV SOLN
INTRAVENOUS | Status: DC | PRN
  Administered 2017-06-13: 150 mg via INTRAVENOUS

## 2017-06-13 MED ORDER — GLYCOPYRROLATE 0.2 MG/ML IJ SOLN
INTRAMUSCULAR | Status: DC | PRN
  Administered 2017-06-13: .3 mg via INTRAVENOUS

## 2017-06-13 MED ORDER — LACTATED RINGERS IV SOLN
INTRAVENOUS | Status: DC
  Administered 2017-06-13 (×2): via INTRAVENOUS

## 2017-06-13 MED ORDER — GABAPENTIN 300 MG PO CAPS
300.0000 mg | ORAL_CAPSULE | ORAL | Status: AC
  Administered 2017-06-13: 300 mg via ORAL

## 2017-06-13 MED ORDER — ACETAMINOPHEN 500 MG PO TABS
ORAL_TABLET | ORAL | Status: AC
  Filled 2017-06-13: qty 2

## 2017-06-13 MED ORDER — BUPIVACAINE-EPINEPHRINE (PF) 0.25% -1:200000 IJ SOLN
INTRAMUSCULAR | Status: AC
  Filled 2017-06-13: qty 30

## 2017-06-13 MED ORDER — CEFAZOLIN SODIUM-DEXTROSE 2-4 GM/100ML-% IV SOLN
2.0000 g | INTRAVENOUS | Status: AC
  Administered 2017-06-13: 2 g via INTRAVENOUS

## 2017-06-13 MED ORDER — ACETAMINOPHEN 500 MG PO TABS
1000.0000 mg | ORAL_TABLET | ORAL | Status: AC
  Administered 2017-06-13: 1000 mg via ORAL

## 2017-06-13 SURGICAL SUPPLY — 34 items
CANISTER SUCT 3000ML PPV (MISCELLANEOUS) IMPLANT
CHLORAPREP W/TINT 26ML (MISCELLANEOUS) ×2 IMPLANT
COVER SURGICAL LIGHT HANDLE (MISCELLANEOUS) ×2 IMPLANT
DECANTER SPIKE VIAL GLASS SM (MISCELLANEOUS) IMPLANT
DERMABOND ADVANCED (GAUZE/BANDAGES/DRESSINGS) ×1
DERMABOND ADVANCED .7 DNX12 (GAUZE/BANDAGES/DRESSINGS) ×1 IMPLANT
DRAPE LAPAROTOMY 100X72 PEDS (DRAPES) ×2 IMPLANT
DRAPE UTILITY XL STRL (DRAPES) ×4 IMPLANT
ELECT CAUTERY BLADE 6.4 (BLADE) ×2 IMPLANT
ELECT REM PT RETURN 9FT ADLT (ELECTROSURGICAL) ×2
ELECTRODE REM PT RTRN 9FT ADLT (ELECTROSURGICAL) ×1 IMPLANT
GAUZE SPONGE 4X4 12PLY STRL (GAUZE/BANDAGES/DRESSINGS) IMPLANT
GLOVE BIO SURGEON STRL SZ7.5 (GLOVE) ×2 IMPLANT
GOWN STRL REUS W/ TWL LRG LVL3 (GOWN DISPOSABLE) ×2 IMPLANT
GOWN STRL REUS W/TWL LRG LVL3 (GOWN DISPOSABLE) ×2
KIT BASIN OR (CUSTOM PROCEDURE TRAY) ×2 IMPLANT
KIT ROOM TURNOVER OR (KITS) ×2 IMPLANT
NEEDLE HYPO 25X1 1.5 SAFETY (NEEDLE) ×2 IMPLANT
NS IRRIG 1000ML POUR BTL (IV SOLUTION) ×2 IMPLANT
PACK SURGICAL SETUP 50X90 (CUSTOM PROCEDURE TRAY) ×2 IMPLANT
PAD ARMBOARD 7.5X6 YLW CONV (MISCELLANEOUS) ×2 IMPLANT
PENCIL BUTTON HOLSTER BLD 10FT (ELECTRODE) ×2 IMPLANT
SPECIMEN JAR SMALL (MISCELLANEOUS) ×2 IMPLANT
SPONGE LAP 18X18 X RAY DECT (DISPOSABLE) ×2 IMPLANT
SUT MNCRL AB 3-0 PS2 18 (SUTURE) ×4 IMPLANT
SUT MNCRL AB 4-0 PS2 18 (SUTURE) ×2 IMPLANT
SUT VIC AB 3-0 SH 27 (SUTURE) ×1
SUT VIC AB 3-0 SH 27XBRD (SUTURE) ×1 IMPLANT
SYR BULB 3OZ (MISCELLANEOUS) ×2 IMPLANT
SYR CONTROL 10ML LL (SYRINGE) ×2 IMPLANT
TOWEL OR 17X24 6PK STRL BLUE (TOWEL DISPOSABLE) ×2 IMPLANT
TOWEL OR 17X26 10 PK STRL BLUE (TOWEL DISPOSABLE) ×2 IMPLANT
TUBE CONNECTING 12X1/4 (SUCTIONS) IMPLANT
YANKAUER SUCT BULB TIP NO VENT (SUCTIONS) IMPLANT

## 2017-06-13 NOTE — Anesthesia Procedure Notes (Signed)
Performed by: Valda Favia

## 2017-06-13 NOTE — Op Note (Signed)
06/13/2017  2:49 PM  PATIENT:  Elizabeth Ashley  51 y.o. female  PRE-OPERATIVE DIAGNOSIS:  LIPOMA OF RIGHT SHOULDER  POST-OPERATIVE DIAGNOSIS:  LIPOMA OF RIGHT SHOULDER  PROCEDURE:  Procedure(s): EXCISION OF RIGHT SHOULDER LIPOMA (Right), 10X6cm  SURGEON:  Surgeon(s) and Role:    * Jovita Kussmaul, MD - Primary  PHYSICIAN ASSISTANT:   ASSISTANTS: none   ANESTHESIA:   local and general  EBL:  Total I/O In: 1000 [I.V.:1000] Out: -   BLOOD ADMINISTERED:none  DRAINS: none   LOCAL MEDICATIONS USED:  MARCAINE     SPECIMEN:  Source of Specimen:  lipoma right shoulder  DISPOSITION OF SPECIMEN:  PATHOLOGY  COUNTS:  YES  TOURNIQUET:  * No tourniquets in log *  DICTATION: .Dragon Dictation   After informed consent was obtained the patient was brought to the operating room and placed in the supine position on the operating table. After adequate induction of general anesthesia the patient was moved into a right side up lateral position on a beanbag and all pressure points are padded. The right shoulder area was then prepped with ChloraPrep, allowed to dry, and draped in usual sterile manner. An appropriate timeout was performed. The lipoma was large and palpable on the posterior aspect of the right shoulder. This area was infiltrated with quarter percent Marcaine with epinephrine. An angled linear incision was made along the natural skin line with a 15 blade knife. The incision was carried through the skin and subcutaneous tissue sharply with electrocautery. The lipoma was identified. It was separated from the rest of the subcutaneous tissue by a combination of blunt hemostat dissection and some sharp dissection with electrocautery. There were many lobules of fat coming off the main body of the specimen and care was taken to try to get every small lobule of the lipoma. It appeared to be isolated in the subcutaneous tissue and did not seem to violate the muscular fascia. The lipoma once removed  measured about 10 x 6 cm. The tissue was sent to pathology for further evaluation. The cavity was then fulgurated with the electrocautery until it was completely hemostatic. The wound was then closed with interrupted 30 and 4-0 Monocryl subcuticular stitches. Dermabond dressings were applied. The patient tolerated the procedure well. At the end of the case all needle sponge and instrument counts were correct. The patient was then awakened and taken to recovery in stable condition.  PLAN OF CARE: Discharge to home after PACU  PATIENT DISPOSITION:  PACU - hemodynamically stable.   Delay start of Pharmacological VTE agent (>24hrs) due to surgical blood loss or risk of bleeding: not applicable

## 2017-06-13 NOTE — Anesthesia Preprocedure Evaluation (Signed)
Anesthesia Evaluation  Patient identified by MRN, date of birth, ID band Patient awake    Reviewed: Allergy & Precautions, NPO status , Patient's Chart, lab work & pertinent test results  Airway Mallampati: I  TM Distance: >3 FB Neck ROM: Full    Dental   Pulmonary    Pulmonary exam normal        Cardiovascular Normal cardiovascular exam     Neuro/Psych    GI/Hepatic   Endo/Other    Renal/GU      Musculoskeletal   Abdominal   Peds  Hematology   Anesthesia Other Findings   Reproductive/Obstetrics                             Anesthesia Physical Anesthesia Plan  ASA: II  Anesthesia Plan: General   Post-op Pain Management:    Induction: Intravenous  PONV Risk Score and Plan: 3 and Ondansetron, Dexamethasone, Propofol and Midazolam  Airway Management Planned: Oral ETT  Additional Equipment:   Intra-op Plan:   Post-operative Plan: Extubation in OR  Informed Consent: I have reviewed the patients History and Physical, chart, labs and discussed the procedure including the risks, benefits and alternatives for the proposed anesthesia with the patient or authorized representative who has indicated his/her understanding and acceptance.     Plan Discussed with: CRNA and Surgeon  Anesthesia Plan Comments:         Anesthesia Quick Evaluation

## 2017-06-13 NOTE — H&P (Signed)
Elizabeth Ashley  Location: [facility] Patient #: [id number] DOB: January 03, 1966 Single / Language: Cleophus Molt / Race: Asian Female   History of Present Illness  The patient is a 51 year old female who presents with a complaint of Mass. We are asked to see the patient in consultation by Dr. Wilfred Lacy to evaluate her for a lipoma. The patient is a 50 year old Asian female who has had a mass on the back of her right shoulder for the last couple years. The area has gradually gotten larger. It does cause her some discomfort. She has not noticed any numbness or weakness in the arm or hand.   Past Surgical History  No pertinent past surgical history   Diagnostic Studies History  Colonoscopy  never Mammogram  never Pap Smear  1-5 years ago  Allergies  No Known Allergies   Medication History  Multivitamin Adults (Oral) Active. Medications Reconciled  Social History  No alcohol use  No caffeine use  No drug use  Tobacco use  Never smoker.  Family History First Degree Relatives  No pertinent family history   Pregnancy / Birth History  Gravida  0 Irregular periods   Other Problems  Breast Cancer     Review of Systems  General Not Present- Appetite Loss, Chills, Fatigue, Fever, Night Sweats, Weight Gain and Weight Loss. Skin Not Present- Change in Wart/Mole, Dryness, Hives, Jaundice, New Lesions, Non-Healing Wounds, Rash and Ulcer. HEENT Not Present- Earache, Hearing Loss, Hoarseness, Nose Bleed, Oral Ulcers, Ringing in the Ears, Seasonal Allergies, Sinus Pain, Sore Throat, Visual Disturbances, Wears glasses/contact lenses and Yellow Eyes. Respiratory Not Present- Bloody sputum, Chronic Cough, Difficulty Breathing, Snoring and Wheezing. Breast Not Present- Breast Mass, Breast Pain, Nipple Discharge and Skin Changes. Cardiovascular Not Present- Chest Pain, Difficulty Breathing Lying Down, Leg Cramps, Palpitations, Rapid Heart Rate, Shortness of Breath and  Swelling of Extremities. Gastrointestinal Not Present- Abdominal Pain, Bloating, Bloody Stool, Change in Bowel Habits, Chronic diarrhea, Constipation, Difficulty Swallowing, Excessive gas, Gets full quickly at meals, Hemorrhoids, Indigestion, Nausea, Rectal Pain and Vomiting. Female Genitourinary Not Present- Frequency, Nocturia, Painful Urination, Pelvic Pain and Urgency. Musculoskeletal Not Present- Back Pain, Joint Pain, Joint Stiffness, Muscle Pain, Muscle Weakness and Swelling of Extremities. Neurological Not Present- Decreased Memory, Fainting, Headaches, Numbness, Seizures, Tingling, Tremor, Trouble walking and Weakness. Psychiatric Not Present- Anxiety, Bipolar, Change in Sleep Pattern, Depression, Fearful and Frequent crying. Endocrine Not Present- Cold Intolerance, Excessive Hunger, Hair Changes, Heat Intolerance, Hot flashes and New Diabetes. Hematology Not Present- Blood Thinners, Easy Bruising, Excessive bleeding, Gland problems, HIV and Persistent Infections.  Vitals Weight: 124.8 lb Height: 64in Body Surface Area: 1.6 m Body Mass Index: 21.42 kg/m  Temp.: 97.36F  Pulse: 71 (Regular)  BP: 120/80 (Sitting, Left Arm, Standard)       Physical Exam  General Mental Status-Alert. General Appearance-Consistent with stated age. Hydration-Well hydrated. Voice-Normal.  Head and Neck Head-normocephalic, atraumatic with no lesions or palpable masses. Trachea-midline. Thyroid Gland Characteristics - normal size and consistency.  Eye Eyeball - Bilateral-Extraocular movements intact. Sclera/Conjunctiva - Bilateral-No scleral icterus.  Chest and Lung Exam Chest and lung exam reveals -quiet, even and easy respiratory effort with no use of accessory muscles and on auscultation, normal breath sounds, no adventitious sounds and normal vocal resonance. Inspection Chest Wall - Normal. Back - normal.  Cardiovascular Cardiovascular examination reveals  -normal heart sounds, regular rate and rhythm with no murmurs and normal pedal pulses bilaterally.  Abdomen Inspection Inspection of the abdomen reveals - No  Hernias. Skin - Scar - no surgical scars. Palpation/Percussion Palpation and Percussion of the abdomen reveal - Soft, Non Tender, No Rebound tenderness, No Rigidity (guarding) and No hepatosplenomegaly. Auscultation Auscultation of the abdomen reveals - Bowel sounds normal.  Neurologic Neurologic evaluation reveals -alert and oriented x 3 with no impairment of recent or remote memory. Mental Status-Normal.  Musculoskeletal Normal Exam - Left-Upper Extremity Strength Normal and Lower Extremity Strength Normal. Normal Exam - Right-Upper Extremity Strength Normal and Lower Extremity Strength Normal. Note: There is an 8 cm fatty mass that appears to be in the subcutaneous tissue on the posterior aspect of the right shoulder. There is good range of motion. There is no weakness.   Lymphatic Head & Neck  General Head & Neck Lymphatics: Bilateral - Description - Normal. Axillary  General Axillary Region: Bilateral - Description - Normal. Tenderness - Non Tender. Femoral & Inguinal  Generalized Femoral & Inguinal Lymphatics: Bilateral - Description - Normal. Tenderness - Non Tender.    Assessment & Plan  LIPOMA OF SHOULDER (D17.20) Impression: The patient appears to have a large lipoma on the back of the right shoulder that measures about 8 cm. Because it has gotten larger and because it causes her some discomfort I think it would be reasonable to remove it. I have discussed with her in detail the risks and benefits of the operation as well as some of the technical aspects including the possibility of needing a drain and she understands and wishes to proceed Current Plans Pt Education - Benign Tumors: discussed with patient and provided information.

## 2017-06-13 NOTE — Anesthesia Procedure Notes (Signed)
Procedure Name: Intubation Date/Time: 06/13/2017 1:31 PM Performed by: Valda Favia Pre-anesthesia Checklist: Patient identified, Emergency Drugs available, Suction available, Patient being monitored and Timeout performed Patient Re-evaluated:Patient Re-evaluated prior to inductionOxygen Delivery Method: Circle system utilized Preoxygenation: Pre-oxygenation with 100% oxygen Intubation Type: IV induction Ventilation: Mask ventilation without difficulty Laryngoscope Size: Mac and 3 Grade View: Grade I Tube type: Oral Tube size: 7.0 mm Number of attempts: 1 Airway Equipment and Method: Stylet Placement Confirmation: ETT inserted through vocal cords under direct vision,  positive ETCO2 and breath sounds checked- equal and bilateral Secured at: 21 cm Tube secured with: Tape Dental Injury: Teeth and Oropharynx as per pre-operative assessment  Comments: Placed by Dicie Beam SRNA

## 2017-06-13 NOTE — Anesthesia Postprocedure Evaluation (Signed)
Anesthesia Post Note  Patient: Elizabeth Ashley  Procedure(s) Performed: Procedure(s) (LRB): EXCISION OF RIGHT SHOULDER LIPOMA (Right)     Patient location during evaluation: PACU Anesthesia Type: General Level of consciousness: awake and alert Pain management: pain level controlled Vital Signs Assessment: post-procedure vital signs reviewed and stable Respiratory status: spontaneous breathing, nonlabored ventilation, respiratory function stable and patient connected to nasal cannula oxygen Cardiovascular status: blood pressure returned to baseline and stable Postop Assessment: no signs of nausea or vomiting Anesthetic complications: no    Last Vitals:  Vitals:   06/13/17 1514 06/13/17 1515  BP:  (!) 147/82  Pulse: 73 72  Resp: 16 13  Temp:      Last Pain:  Vitals:   06/13/17 1515  TempSrc:   PainSc: 0-No pain                 Babita Amaker DAVID

## 2017-06-13 NOTE — Progress Notes (Signed)
Pt. Did not drink Boost. When she was seen in pre-admission there were no orders.  Orders received after pt. Completed pre-admit appointment,

## 2017-06-13 NOTE — Transfer of Care (Signed)
Immediate Anesthesia Transfer of Care Note  Patient: Elizabeth Ashley  Procedure(s) Performed: Procedure(s): EXCISION OF RIGHT SHOULDER LIPOMA (Right)  Patient Location: PACU  Anesthesia Type:General  Level of Consciousness: awake, alert  and oriented  Airway & Oxygen Therapy: Patient Spontanous Breathing and Patient connected to nasal cannula oxygen  Post-op Assessment: Report given to RN and Post -op Vital signs reviewed and stable  Post vital signs: Reviewed and stable  Last Vitals:  Vitals:   06/13/17 1125 06/13/17 1506  BP: 111/64 (!) 153/90  Pulse: (!) 56 80  Resp: 20 17  Temp: 36.7 C 36.2 C    Last Pain:  Vitals:   06/13/17 1506  TempSrc:   PainSc: 0-No pain         Complications: No apparent anesthesia complications

## 2017-06-14 ENCOUNTER — Encounter (HOSPITAL_COMMUNITY): Payer: Self-pay | Admitting: General Surgery

## 2017-09-18 ENCOUNTER — Ambulatory Visit (INDEPENDENT_AMBULATORY_CARE_PROVIDER_SITE_OTHER): Payer: BLUE CROSS/BLUE SHIELD

## 2017-09-18 DIAGNOSIS — Z23 Encounter for immunization: Secondary | ICD-10-CM | POA: Diagnosis not present

## 2018-03-14 ENCOUNTER — Other Ambulatory Visit: Payer: Self-pay | Admitting: Nurse Practitioner

## 2018-03-14 DIAGNOSIS — Z1231 Encounter for screening mammogram for malignant neoplasm of breast: Secondary | ICD-10-CM

## 2018-04-24 ENCOUNTER — Ambulatory Visit
Admission: RE | Admit: 2018-04-24 | Discharge: 2018-04-24 | Disposition: A | Discharge status: Home / Self Care | Payer: BLUE CROSS/BLUE SHIELD | Source: Ambulatory Visit | Attending: Nurse Practitioner | Admitting: Nurse Practitioner

## 2018-04-24 DIAGNOSIS — Z1231 Encounter for screening mammogram for malignant neoplasm of breast: Secondary | ICD-10-CM

## 2018-09-22 ENCOUNTER — Ambulatory Visit: Payer: BLUE CROSS/BLUE SHIELD | Admitting: Family

## 2018-09-23 ENCOUNTER — Ambulatory Visit: Payer: BLUE CROSS/BLUE SHIELD | Admitting: Family

## 2018-09-23 ENCOUNTER — Encounter: Payer: Self-pay | Admitting: Family

## 2018-09-23 ENCOUNTER — Other Ambulatory Visit (INDEPENDENT_AMBULATORY_CARE_PROVIDER_SITE_OTHER): Payer: BLUE CROSS/BLUE SHIELD

## 2018-09-23 VITALS — BP 110/78 | HR 58 | Temp 98.2°F | Ht 60.0 in | Wt 124.2 lb

## 2018-09-23 DIAGNOSIS — Z1211 Encounter for screening for malignant neoplasm of colon: Secondary | ICD-10-CM

## 2018-09-23 DIAGNOSIS — Z0000 Encounter for general adult medical examination without abnormal findings: Secondary | ICD-10-CM | POA: Diagnosis not present

## 2018-09-23 DIAGNOSIS — Z1321 Encounter for screening for nutritional disorder: Secondary | ICD-10-CM | POA: Diagnosis not present

## 2018-09-23 DIAGNOSIS — Z13220 Encounter for screening for lipoid disorders: Secondary | ICD-10-CM

## 2018-09-23 DIAGNOSIS — Z23 Encounter for immunization: Secondary | ICD-10-CM

## 2018-09-23 LAB — LIPID PANEL
Cholesterol: 217 mg/dL — ABNORMAL HIGH (ref 0–200)
HDL: 65.7 mg/dL (ref >39.00)
LDL Cholesterol: 130 mg/dL — ABNORMAL HIGH (ref 0–99)
NONHDL: 150.88
TRIGLYCERIDES: 102 mg/dL (ref 0.0–149.0)
Total CHOL/HDL Ratio: 3
VLDL: 20.4 mg/dL (ref 0.0–40.0)

## 2018-09-23 LAB — CBC WITH DIFFERENTIAL/PLATELET
BASOS ABS: 0 10*3/uL (ref 0.0–0.1)
BASOS PCT: 0.6 % (ref 0.0–3.0)
EOS ABS: 0.1 10*3/uL (ref 0.0–0.7)
Eosinophils Relative: 1.5 % (ref 0.0–5.0)
HEMATOCRIT: 38.8 % (ref 36.0–46.0)
Hemoglobin: 12.8 g/dL (ref 12.0–15.0)
LYMPHS ABS: 1.8 10*3/uL (ref 0.7–4.0)
LYMPHS PCT: 37.3 % (ref 12.0–46.0)
MCHC: 32.9 g/dL (ref 30.0–36.0)
MCV: 92.1 fl (ref 78.0–100.0)
MONO ABS: 0.4 10*3/uL (ref 0.1–1.0)
Monocytes Relative: 8.7 % (ref 3.0–12.0)
NEUTROS ABS: 2.5 10*3/uL (ref 1.4–7.7)
NEUTROS PCT: 51.9 % (ref 43.0–77.0)
PLATELETS: 290 10*3/uL (ref 150.0–400.0)
RBC: 4.22 Mil/uL (ref 3.87–5.11)
RDW: 13.1 % (ref 11.5–15.5)
WBC: 4.7 10*3/uL (ref 4.0–10.5)

## 2018-09-23 LAB — COMPREHENSIVE METABOLIC PANEL
ALT: 18 U/L (ref 0–35)
AST: 18 U/L (ref 0–37)
Albumin: 4.5 g/dL (ref 3.5–5.2)
Alkaline Phosphatase: 56 U/L (ref 39–117)
BILIRUBIN TOTAL: 0.7 mg/dL (ref 0.2–1.2)
BUN: 15 mg/dL (ref 6–23)
CALCIUM: 9.7 mg/dL (ref 8.4–10.5)
CO2: 31 meq/L (ref 19–32)
CREATININE: 0.65 mg/dL (ref 0.40–1.20)
Chloride: 103 mEq/L (ref 96–112)
GFR: 101.16 mL/min (ref >60.00)
Glucose, Bld: 104 mg/dL — ABNORMAL HIGH (ref 70–99)
Potassium: 4 mEq/L (ref 3.5–5.1)
SODIUM: 140 meq/L (ref 135–145)
Total Protein: 7.8 g/dL (ref 6.0–8.3)

## 2018-09-23 LAB — VITAMIN D 25 HYDROXY (VIT D DEFICIENCY, FRACTURES): VITD: 53.1 ng/mL (ref 30.00–100.00)

## 2018-09-23 NOTE — Patient Instructions (Signed)

## 2018-09-23 NOTE — Progress Notes (Signed)
Elizabeth Ashley is a 52 y.o. female with the following history as recorded in [employer]:  Patient Active Problem List   Diagnosis Date Noted  . Mass of skin of shoulder, right 03/28/2017    Current Outpatient Medications  Medication Sig Dispense Refill  . Multiple Vitamin (MULTIVITAMIN WITH MINERALS) TABS tablet Take 1 tablet by mouth daily. Centrum    . CALCIUM-VITAMIN D PO Take 1 tablet by mouth 3 (three) times a week.     No current facility-administered medications for this visit.     Allergies: No known allergies  Past Medical History:  Diagnosis Date  . Medical history non-contributory     Past Surgical History:  Procedure Laterality Date  . LIPOMA EXCISION Right 06/13/2017   Procedure: EXCISION OF RIGHT SHOULDER LIPOMA;  Surgeon: Jovita Kussmaul, MD;  Location: [facility];  Service: General;  Laterality: Right;    History reviewed. No pertinent family history.  Social History   Tobacco Use  . Smoking status: Never Smoker  . Smokeless tobacco: Never Used  Substance Use Topics  . Alcohol use: Yes    Comment: everyday    Subjective:  Patient presents for TOC/ yearly CPE today; in baseline state of health; would like to get flu shot updated today; needs to get mammogram updated; does occasionally have problems sleeping but does not want any medication. Exercises 3 x per week; up to date with eye doctor and dentist;   Pap Smear in 2018- repeat 2021; Mammogram done in 03/2018; Colonoscopy- order updated; Tdap- up to date; Flu shot- done today;    Review of Systems  Constitutional: Negative for fever and weight loss.  HENT: Negative for ear pain and hearing loss.   Eyes: Negative for blurred vision.  Respiratory: Negative for shortness of breath.   Cardiovascular: Negative for chest pain.  Gastrointestinal: Negative for abdominal pain, constipation and diarrhea.  Genitourinary: Negative for dysuria.  Musculoskeletal: Negative for neck pain.  Skin: Negative.   Neurological:  Negative for dizziness.  Psychiatric/Behavioral: Negative for depression and memory loss. The patient has insomnia.       Objective:  Vitals:   09/23/18 1347  BP: 110/78  Pulse: (!) 58  Temp: 98.2 F (36.8 C)  TempSrc: Oral  SpO2: 97%  Weight: 124 lb 3.2 oz (56.3 kg)  Height: 5' (1.524 m)    General: Well developed, well nourished, in no acute distress  Skin : Warm and dry.  Head: Normocephalic and atraumatic  Eyes: Sclera and conjunctiva clear; pupils round and reactive to light; extraocular movements intact  Ears: External normal; canals clear; tympanic membranes normal  Oropharynx: Pink, supple. No suspicious lesions  Neck: Supple without thyromegaly, adenopathy  Lungs: Respirations unlabored; clear to auscultation bilaterally without wheeze, rales, rhonchi  CVS exam: normal rate and regular rhythm.  Abdomen: Soft; nontender; nondistended; normoactive bowel sounds; no masses or hepatosplenomegaly  Musculoskeletal: No deformities; no active joint inflammation  Extremities: No edema, cyanosis, clubbing  Vessels: Symmetric bilaterally  Neurologic: Alert and oriented; speech intact; face symmetrical; moves all extremities well; CNII-XII intact without focal deficit  Assessment:  1. PE (physical exam), annual   2. Encounter for screening colonoscopy   3. Lipid screening   4. Encounter for vitamin deficiency screening     Plan:  Age appropriate preventive healthcare needs addressed; encouraged regular eye doctor and dental exams; encouraged regular exercise; will update labs and refills as needed today; follow-up to be determined; Flu shot updated; order for colonoscopy updated; congratulated patient on  commitment to health; follow-up in 1 year, sooner prn.    No follow-ups on file.  Orders Placed This Encounter  Procedures  . CBC w/Diff    Standing Status:   Future    Standing Expiration Date:   09/23/2019  . Comp Met (CMET)    Standing Status:   Future    Standing  Expiration Date:   09/23/2019  . Vitamin D (25 hydroxy)    Standing Status:   Future    Standing Expiration Date:   09/23/2019  . Lipid panel    Standing Status:   Future    Standing Expiration Date:   09/24/2019  . Ambulatory referral to Gastroenterology    Referral Priority:   Routine    Referral Type:   Consultation    Referral Reason:   Specialty Services Required    Number of Visits Requested:   1    Requested Prescriptions    No prescriptions requested or ordered in this encounter

## 2018-09-23 NOTE — Addendum Note (Signed)
Addended by: Marcina Millard on: 09/23/2018 02:48 PM   Modules accepted: Orders

## 2018-11-03 ENCOUNTER — Encounter: Payer: Self-pay | Admitting: Family

## 2020-03-24 ENCOUNTER — Ambulatory Visit: Payer: BLUE CROSS/BLUE SHIELD | Attending: Internal Medicine

## 2020-03-24 DIAGNOSIS — Z23 Encounter for immunization: Secondary | ICD-10-CM

## 2020-03-24 NOTE — Progress Notes (Signed)
   Covid-19 Vaccination Clinic  Name:  Elizabeth Ashley    MRN: [id number] DOB: 23-Aug-1966  03/24/2020  [name] was observed post Covid-19 immunization for 15 minutes without incident. She was provided with Vaccine Information Sheet and instruction to access the V-Safe system.   [name] was instructed to call 911 with any severe reactions post vaccine: Marland Kitchen Difficulty breathing  . Swelling of face and throat  . A fast heartbeat  . A bad rash all over body  . Dizziness and weakness   Immunizations Administered    Name Date Dose VIS Date Route   Pfizer COVID-19 Vaccine 03/24/2020  2:47 PM 0.3 mL 12/11/2019 Intramuscular   Manufacturer: [place]   Lot: [id number]   [place]: [id number]

## 2020-04-20 ENCOUNTER — Ambulatory Visit: Payer: BLUE CROSS/BLUE SHIELD | Attending: Internal Medicine

## 2020-04-20 DIAGNOSIS — Z23 Encounter for immunization: Secondary | ICD-10-CM

## 2020-04-20 NOTE — Progress Notes (Signed)
   Covid-19 Vaccination Clinic  Name:  Ayman Fonseca    MRN: [id number] DOB: Aug 17, 1966  04/20/2020  Ms. Raz was observed post Covid-19 immunization for 15 minutes without incident. She was provided with Vaccine Information Sheet and instruction to access the V-Safe system.   Ms. Fusco was instructed to call 911 with any severe reactions post vaccine: Marland Kitchen Difficulty breathing  . Swelling of face and throat  . A fast heartbeat  . A bad rash all over body  . Dizziness and weakness   Immunizations Administered    Name Date Dose VIS Date Route   Pfizer COVID-19 Vaccine 04/20/2020  2:25 PM 0.3 mL 02/24/2019 Intramuscular   Manufacturer: [place]   Lot: [id number]   [place]: [id number]
